# Patient Record
Sex: Male | Born: 1970 | Race: White | Hispanic: No | Marital: Married | Smoking: Former smoker
Health system: Southern US, Community
[De-identification: ages and names within clinical notes are randomized; demographics above are authoritative.]

## PROBLEM LIST (undated history)

## (undated) DIAGNOSIS — I4891 Unspecified atrial fibrillation: Secondary | ICD-10-CM

## (undated) HISTORY — PX: HERNIA REPAIR: SHX51

## (undated) HISTORY — PX: APPENDECTOMY: SHX54

## (undated) HISTORY — PX: CHOLECYSTECTOMY: SHX55

---

## 2012-06-30 ENCOUNTER — Encounter (HOSPITAL_COMMUNITY): Payer: Self-pay | Admitting: Emergency Medicine

## 2012-06-30 ENCOUNTER — Emergency Department (HOSPITAL_COMMUNITY)
Admission: EM | Admit: 2012-06-30 | Discharge: 2012-07-01 | Disposition: A | Discharge status: Home / Self Care | Payer: BC Managed Care – PPO | Attending: Emergency Medicine | Admitting: Emergency Medicine

## 2012-06-30 DIAGNOSIS — R002 Palpitations: Secondary | ICD-10-CM

## 2012-06-30 DIAGNOSIS — R259 Unspecified abnormal involuntary movements: Secondary | ICD-10-CM | POA: Insufficient documentation

## 2012-06-30 DIAGNOSIS — Z87891 Personal history of nicotine dependence: Secondary | ICD-10-CM | POA: Insufficient documentation

## 2012-06-30 DIAGNOSIS — I499 Cardiac arrhythmia, unspecified: Secondary | ICD-10-CM | POA: Insufficient documentation

## 2012-06-30 DIAGNOSIS — R059 Cough, unspecified: Secondary | ICD-10-CM | POA: Insufficient documentation

## 2012-06-30 DIAGNOSIS — R05 Cough: Secondary | ICD-10-CM | POA: Insufficient documentation

## 2012-06-30 DIAGNOSIS — Z8679 Personal history of other diseases of the circulatory system: Secondary | ICD-10-CM | POA: Insufficient documentation

## 2012-06-30 HISTORY — DX: Unspecified atrial fibrillation: I48.91

## 2012-06-30 NOTE — ED Notes (Signed)
Pt states that he has been having pressure and dizziness in his head and fluttering in his chest especially at night, which wakes him up from sleep. Pt states he had an episode of a-fib about 6 months ago.

## 2012-06-30 NOTE — ED Notes (Signed)
EKG given to [facility] at 23:16

## 2012-06-30 NOTE — ED Notes (Signed)
POCT CBG unable to be completed. Unable to draw blood from fingertips after multiple tries. 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 Will need to be checked with a blood draw.

## 2012-07-01 ENCOUNTER — Emergency Department (HOSPITAL_COMMUNITY): Payer: BC Managed Care – PPO

## 2012-07-01 LAB — BASIC METABOLIC PANEL
BUN: 15 mg/dL (ref 6–23)
Calcium: 8.7 mg/dL (ref 8.4–10.5)
Creatinine, Ser: 0.86 mg/dL (ref 0.50–1.35)
GFR calc non Af Amer: >90 mL/min (ref >90)
Glucose, Bld: 103 mg/dL — ABNORMAL HIGH (ref 70–99)

## 2012-07-01 LAB — CBC WITH DIFFERENTIAL/PLATELET
Eosinophils Absolute: 0.8 10*3/uL — ABNORMAL HIGH (ref 0.0–0.7)
Eosinophils Relative: 8 % — ABNORMAL HIGH (ref 0–5)
Hemoglobin: 17.4 g/dL — ABNORMAL HIGH (ref 13.0–17.0)
Lymphs Abs: 2.3 10*3/uL (ref 0.7–4.0)
MCH: 31.4 pg (ref 26.0–34.0)
MCV: 90.4 fL (ref 78.0–100.0)
Monocytes Relative: 11 % (ref 3–12)
Platelets: 216 10*3/uL (ref 150–400)
RBC: 5.54 MIL/uL (ref 4.22–5.81)

## 2012-07-01 LAB — LIPASE, BLOOD: Lipase: 40 U/L (ref 11–59)

## 2012-07-01 LAB — RAPID URINE DRUG SCREEN, HOSP PERFORMED: Barbiturates: NOT DETECTED

## 2012-07-01 LAB — POCT I-STAT TROPONIN I

## 2012-07-01 MED ORDER — METOPROLOL SUCCINATE ER 25 MG PO TB24: 12.5000 mg | ORAL_TABLET | Freq: Every day | ORAL | Status: AC

## 2012-07-01 MED ORDER — ONDANSETRON 8 MG PO TBDP: 8.0000 mg | ORAL_TABLET | Freq: Once | ORAL | Status: DC

## 2012-07-01 MED ORDER — PANTOPRAZOLE SODIUM 40 MG PO TBEC: 40.0000 mg | DELAYED_RELEASE_TABLET | Freq: Every day | ORAL | Status: DC

## 2012-07-01 MED ORDER — GI COCKTAIL ~~LOC~~: 30.0000 mL | Freq: Once | ORAL | Status: DC

## 2012-07-01 NOTE — ED Provider Notes (Signed)
History     CSN: [id number]  Arrival date & time 06/30/12  2240   First MD Initiated Contact with Patient 06/30/12 2326      Chief Complaint  Patient presents with  . Dizziness    (Consider location/radiation/quality/duration/timing/severity/associated sxs/prior treatment) HPI History provided by pt.   Pt reports that he was diagnosed w/ atrial fibrillation at [facility] 4 months ago.  Had presented w/ complaint of waking with full body shaking and irregular heart rate.  Was told that it may be related to acid reflux, to take prilosec, and if it did not resolve, he would have to follow up with cardiology.  He has since started this medication, quit smoking and cut back on alcohol intake, but continues to have these sx intermittently.  For the past 2 weeks, has been woken every night w/ irregular and rapid heart rate w/ diffuse tremors.  Had simultaneous onset of intermittent, brief episodes of lightheadedness, that occur at rest, during the day, and are not associated w/ headache, CP, SOB, palpitations.  Has had cough recently but denies fever, vomiting, diarrhea, abd pain, urinary sx and hematochezia/melena.  Denies drug abuse.  Does not drink a lot of caffeine.  No new medications.  Past Medical History  Diagnosis Date  . A-fib     Past Surgical History  Procedure Laterality Date  . Cholecystectomy    . Appendectomy    . Hernia repair      Family History  Problem Relation Age of Onset  . Diabetes Mother   . Hypertension Mother   . Hyperlipidemia Mother     History  Substance Use Topics  . Smoking status: Former Smoker    Quit date: 04/18/2012  . Smokeless tobacco: Never Used  . Alcohol Use: 12.6 oz/week    21 Cans of beer per week     Comment: Drinks a 40oz every night      Review of Systems  Neurological:       Uncontrollable tongue rolling for the past 6 months as well as recent blurred vision when looking at computer.  Today he was typing letters twice.   All other systems reviewed and are negative.    Allergies  Review of patient's allergies indicates no known allergies.  Home Medications  No current outpatient prescriptions on file.  BP 140/78  Pulse 78  Temp(Src) 97.7 F (36.5 C) (Oral)  Resp 16  Ht 6\' 1"  (1.854 m)  Wt 225 lb (102.059 kg)  BMI 29.69 kg/m2  SpO2 97%  Physical Exam  Nursing note and vitals reviewed. Constitutional: He is oriented to person, place, and time. He appears well-developed and well-nourished. No distress.  HENT:  Head: Normocephalic and atraumatic.  Eyes:  Normal appearance  Neck: Normal range of motion.  No meningeal signs  Cardiovascular: Normal rate and regular rhythm.   Pulmonary/Chest: Effort normal and breath sounds normal. No respiratory distress.  Abdominal: Soft. Bowel sounds are normal. He exhibits no distension and no mass. There is no tenderness. There is no rebound and no guarding.  Genitourinary:  No CVA tenderness  Musculoskeletal: Normal range of motion.  Neurological: He is alert and oriented to person, place, and time.  CN 3-12 intact.  No sensory deficits.  5/5 and equal upper and lower extremity strength.  No past pointing.   Skin: Skin is warm and dry. No rash noted.  Psychiatric: He has a normal mood and affect. His behavior is normal.    ED Course  Procedures (  including critical care time)   Date: 07/01/2012  Rate: 71  Rhythm: normal sinus rhythm  QRS Axis: normal  Intervals: normal  ST/T Wave abnormalities: normal  Conduction Disutrbances:none  Narrative Interpretation:   Old EKG Reviewed: none available   Labs Reviewed  CBC WITH DIFFERENTIAL - Abnormal; Notable for the following:    Hemoglobin 17.4 (*)    Monocytes Absolute 1.1 (*)    Eosinophils Relative 8 (*)    Eosinophils Absolute 0.8 (*)    All other components within normal limits  BASIC METABOLIC PANEL - Abnormal; Notable for the following:    Glucose, Bld 103 (*)    All other components  within normal limits  URINE RAPID DRUG SCREEN (HOSP PERFORMED)  LIPASE, BLOOD  POCT I-STAT TROPONIN I   Dg Chest 2 View  07/01/2012  *RADIOLOGY REPORT*  Clinical Data: Shortness of breath, cough, congestion, dizziness.  CHEST - 2 VIEW  Comparison: 07/11/2011  Findings: Mild hyperinflation.  Peribronchial thickening with central interstitial changes suggesting chronic bronchitis.  No focal airspace consolidation in the lungs.  No blunting of costophrenic angles.  No pneumothorax.  Mediastinal contours appear intact.  No significant changes since the previous study.  IMPRESSION: Mild hyperinflation with chronic bronchitic changes.  No evidence of active pulmonary disease.   Original Report Authenticated By: Burman Nieves, M.D.    Ct Head Wo Contrast  07/01/2012  *RADIOLOGY REPORT*  Clinical Data: Pressure and dizziness in the head and flutter in the chest especially at night.  Episode of atrial fibrillation 6 months ago.  CT HEAD WITHOUT CONTRAST  Technique:  Contiguous axial images were obtained from the base of the skull through the vertex without contrast.  Comparison: None.  Findings: The ventricles and sulci are symmetrical without significant effacement, displacement, or dilatation. No mass effect or midline shift. No abnormal extra-axial fluid collections. The grey-white matter junction is distinct. Basal cisterns are not effaced. No acute intracranial hemorrhage. No depressed skull fractures.  There is partial opacification of bilateral ethmoid air cells.  Mucous membrane thickening in the frontal and sphenoid sinuses.  Retention cysts in the maxillary antra bilaterally. Mastoid air cells are not opacified.  IMPRESSION: No acute intracranial abnormalities.   Original Report Authenticated By: Burman Nieves, M.D.      1. Palpitations       MDM  41yo M, diagnosed w/ atrial fibrillation at [facility] ~4 months ago, otherwise healthy, presents w/ c/o palpitations and intermittent,  brief episodes of self-limited lightheadedness.  No CP/SOB, no h/o anxiety and no recent illnesses, medication changes or drug/alcohol abuse.  Also c/o intermittent blurred vision and uncontrolled tongue movements. On exam, afebrile, heart w/ RRR, lungs clear, no focal neuro deficits.  EKS shows NSR and labs unremarkable.   CXR and CT head negative.  All results discussed w/ patient.  Dr. Patria Mane recommended Cardiology f/u and prescription for low dose beta blocker.  Return precautions discussed.         Otilio Miu, PA-C 07/01/12 [phone]

## 2012-07-02 NOTE — ED Provider Notes (Signed)
Medical screening examination/treatment/procedure(s) were performed by non-physician practitioner and as supervising physician I was immediately available for consultation/collaboration.  Lyanne Co, MD 07/02/12 [phone]

## 2015-06-26 DIAGNOSIS — G4733 Obstructive sleep apnea (adult) (pediatric): Secondary | ICD-10-CM | POA: Diagnosis not present

## 2015-07-27 DIAGNOSIS — G4733 Obstructive sleep apnea (adult) (pediatric): Secondary | ICD-10-CM | POA: Diagnosis not present

## 2016-12-29 DIAGNOSIS — Z7689 Persons encountering health services in other specified circumstances: Secondary | ICD-10-CM | POA: Diagnosis not present

## 2016-12-29 DIAGNOSIS — Z1389 Encounter for screening for other disorder: Secondary | ICD-10-CM | POA: Diagnosis not present

## 2016-12-29 DIAGNOSIS — Z1329 Encounter for screening for other suspected endocrine disorder: Secondary | ICD-10-CM | POA: Diagnosis not present

## 2016-12-29 DIAGNOSIS — Z13220 Encounter for screening for lipoid disorders: Secondary | ICD-10-CM | POA: Diagnosis not present

## 2016-12-29 DIAGNOSIS — M542 Cervicalgia: Secondary | ICD-10-CM | POA: Diagnosis not present

## 2017-01-10 DIAGNOSIS — Z7189 Other specified counseling: Secondary | ICD-10-CM | POA: Diagnosis not present

## 2017-01-10 DIAGNOSIS — Z0000 Encounter for general adult medical examination without abnormal findings: Secondary | ICD-10-CM | POA: Diagnosis not present

## 2017-01-10 DIAGNOSIS — Z6830 Body mass index (BMI) 30.0-30.9, adult: Secondary | ICD-10-CM | POA: Diagnosis not present

## 2017-01-16 DIAGNOSIS — M542 Cervicalgia: Secondary | ICD-10-CM | POA: Diagnosis not present

## 2017-01-24 DIAGNOSIS — Z6830 Body mass index (BMI) 30.0-30.9, adult: Secondary | ICD-10-CM | POA: Diagnosis not present

## 2017-01-24 DIAGNOSIS — R42 Dizziness and giddiness: Secondary | ICD-10-CM | POA: Diagnosis not present

## 2017-01-24 DIAGNOSIS — M542 Cervicalgia: Secondary | ICD-10-CM | POA: Diagnosis not present

## 2017-01-30 DIAGNOSIS — M542 Cervicalgia: Secondary | ICD-10-CM | POA: Diagnosis not present

## 2017-02-07 DIAGNOSIS — F439 Reaction to severe stress, unspecified: Secondary | ICD-10-CM | POA: Diagnosis not present

## 2017-02-07 DIAGNOSIS — Z6830 Body mass index (BMI) 30.0-30.9, adult: Secondary | ICD-10-CM | POA: Diagnosis not present

## 2017-02-07 DIAGNOSIS — M542 Cervicalgia: Secondary | ICD-10-CM | POA: Diagnosis not present

## 2017-02-28 DIAGNOSIS — R413 Other amnesia: Secondary | ICD-10-CM | POA: Diagnosis not present

## 2017-02-28 DIAGNOSIS — Z6831 Body mass index (BMI) 31.0-31.9, adult: Secondary | ICD-10-CM | POA: Diagnosis not present

## 2017-03-29 DIAGNOSIS — M546 Pain in thoracic spine: Secondary | ICD-10-CM | POA: Diagnosis not present

## 2017-03-29 DIAGNOSIS — Z6830 Body mass index (BMI) 30.0-30.9, adult: Secondary | ICD-10-CM | POA: Diagnosis not present

## 2017-03-29 DIAGNOSIS — R030 Elevated blood-pressure reading, without diagnosis of hypertension: Secondary | ICD-10-CM | POA: Diagnosis not present

## 2017-04-04 DIAGNOSIS — M5414 Radiculopathy, thoracic region: Secondary | ICD-10-CM | POA: Diagnosis not present

## 2017-04-10 DIAGNOSIS — R002 Palpitations: Secondary | ICD-10-CM | POA: Diagnosis not present

## 2017-04-10 DIAGNOSIS — H538 Other visual disturbances: Secondary | ICD-10-CM | POA: Diagnosis not present

## 2017-04-10 DIAGNOSIS — R531 Weakness: Secondary | ICD-10-CM | POA: Diagnosis not present

## 2017-04-10 DIAGNOSIS — Z9989 Dependence on other enabling machines and devices: Secondary | ICD-10-CM | POA: Diagnosis not present

## 2017-04-10 DIAGNOSIS — Z87891 Personal history of nicotine dependence: Secondary | ICD-10-CM | POA: Diagnosis not present

## 2017-04-10 DIAGNOSIS — R51 Headache: Secondary | ICD-10-CM | POA: Diagnosis not present

## 2017-04-10 DIAGNOSIS — R42 Dizziness and giddiness: Secondary | ICD-10-CM | POA: Diagnosis not present

## 2017-04-11 DIAGNOSIS — R55 Syncope and collapse: Secondary | ICD-10-CM | POA: Diagnosis not present

## 2017-04-11 DIAGNOSIS — R631 Polydipsia: Secondary | ICD-10-CM | POA: Diagnosis not present

## 2017-04-11 DIAGNOSIS — R0609 Other forms of dyspnea: Secondary | ICD-10-CM | POA: Diagnosis not present

## 2017-04-11 DIAGNOSIS — G4733 Obstructive sleep apnea (adult) (pediatric): Secondary | ICD-10-CM | POA: Diagnosis not present

## 2017-04-11 DIAGNOSIS — K219 Gastro-esophageal reflux disease without esophagitis: Secondary | ICD-10-CM | POA: Diagnosis not present

## 2017-04-11 DIAGNOSIS — R197 Diarrhea, unspecified: Secondary | ICD-10-CM | POA: Diagnosis not present

## 2017-04-11 DIAGNOSIS — R42 Dizziness and giddiness: Secondary | ICD-10-CM | POA: Diagnosis not present

## 2017-04-11 DIAGNOSIS — I10 Essential (primary) hypertension: Secondary | ICD-10-CM | POA: Diagnosis not present

## 2017-04-11 DIAGNOSIS — R358 Other polyuria: Secondary | ICD-10-CM | POA: Diagnosis not present

## 2017-04-11 DIAGNOSIS — R030 Elevated blood-pressure reading, without diagnosis of hypertension: Secondary | ICD-10-CM | POA: Diagnosis not present

## 2017-04-11 DIAGNOSIS — F419 Anxiety disorder, unspecified: Secondary | ICD-10-CM | POA: Diagnosis not present

## 2017-04-11 DIAGNOSIS — R0789 Other chest pain: Secondary | ICD-10-CM | POA: Diagnosis not present

## 2017-04-11 DIAGNOSIS — Z9989 Dependence on other enabling machines and devices: Secondary | ICD-10-CM | POA: Diagnosis not present

## 2017-04-11 DIAGNOSIS — R200 Anesthesia of skin: Secondary | ICD-10-CM | POA: Diagnosis not present

## 2017-04-11 DIAGNOSIS — R079 Chest pain, unspecified: Secondary | ICD-10-CM | POA: Diagnosis not present

## 2017-04-12 DIAGNOSIS — R0789 Other chest pain: Secondary | ICD-10-CM | POA: Diagnosis not present

## 2017-04-20 DIAGNOSIS — Z6830 Body mass index (BMI) 30.0-30.9, adult: Secondary | ICD-10-CM | POA: Diagnosis not present

## 2017-04-20 DIAGNOSIS — R42 Dizziness and giddiness: Secondary | ICD-10-CM | POA: Diagnosis not present

## 2017-05-02 DIAGNOSIS — M546 Pain in thoracic spine: Secondary | ICD-10-CM | POA: Diagnosis not present

## 2017-05-02 DIAGNOSIS — R030 Elevated blood-pressure reading, without diagnosis of hypertension: Secondary | ICD-10-CM | POA: Diagnosis not present

## 2017-05-02 DIAGNOSIS — Z6830 Body mass index (BMI) 30.0-30.9, adult: Secondary | ICD-10-CM | POA: Diagnosis not present

## 2017-05-02 DIAGNOSIS — M545 Low back pain: Secondary | ICD-10-CM | POA: Diagnosis not present

## 2017-05-17 DIAGNOSIS — R51 Headache: Secondary | ICD-10-CM | POA: Diagnosis not present

## 2017-05-17 DIAGNOSIS — R002 Palpitations: Secondary | ICD-10-CM | POA: Diagnosis not present

## 2017-05-17 DIAGNOSIS — R42 Dizziness and giddiness: Secondary | ICD-10-CM | POA: Diagnosis not present

## 2017-05-17 DIAGNOSIS — Z6830 Body mass index (BMI) 30.0-30.9, adult: Secondary | ICD-10-CM | POA: Diagnosis not present

## 2017-05-17 DIAGNOSIS — R413 Other amnesia: Secondary | ICD-10-CM | POA: Diagnosis not present

## 2017-05-17 DIAGNOSIS — G4733 Obstructive sleep apnea (adult) (pediatric): Secondary | ICD-10-CM | POA: Diagnosis not present

## 2017-05-17 DIAGNOSIS — Z9049 Acquired absence of other specified parts of digestive tract: Secondary | ICD-10-CM | POA: Diagnosis not present

## 2017-05-17 DIAGNOSIS — Z87891 Personal history of nicotine dependence: Secondary | ICD-10-CM | POA: Diagnosis not present

## 2017-05-17 DIAGNOSIS — R0789 Other chest pain: Secondary | ICD-10-CM | POA: Diagnosis not present

## 2017-05-17 DIAGNOSIS — Z9989 Dependence on other enabling machines and devices: Secondary | ICD-10-CM | POA: Diagnosis not present

## 2017-05-29 DIAGNOSIS — R569 Unspecified convulsions: Secondary | ICD-10-CM | POA: Diagnosis not present

## 2017-05-29 DIAGNOSIS — R413 Other amnesia: Secondary | ICD-10-CM | POA: Diagnosis not present

## 2017-05-30 DIAGNOSIS — R413 Other amnesia: Secondary | ICD-10-CM | POA: Diagnosis not present

## 2017-05-30 DIAGNOSIS — R569 Unspecified convulsions: Secondary | ICD-10-CM | POA: Diagnosis not present

## 2017-05-31 DIAGNOSIS — R413 Other amnesia: Secondary | ICD-10-CM | POA: Diagnosis not present

## 2017-05-31 DIAGNOSIS — G4733 Obstructive sleep apnea (adult) (pediatric): Secondary | ICD-10-CM | POA: Diagnosis not present

## 2017-06-15 DIAGNOSIS — R0981 Nasal congestion: Secondary | ICD-10-CM | POA: Diagnosis not present

## 2017-06-15 DIAGNOSIS — R413 Other amnesia: Secondary | ICD-10-CM | POA: Diagnosis not present

## 2017-06-15 DIAGNOSIS — G4733 Obstructive sleep apnea (adult) (pediatric): Secondary | ICD-10-CM | POA: Diagnosis not present

## 2017-06-15 DIAGNOSIS — G479 Sleep disorder, unspecified: Secondary | ICD-10-CM | POA: Diagnosis not present

## 2017-06-21 DIAGNOSIS — G8929 Other chronic pain: Secondary | ICD-10-CM | POA: Diagnosis not present

## 2017-06-21 DIAGNOSIS — M5441 Lumbago with sciatica, right side: Secondary | ICD-10-CM | POA: Diagnosis not present

## 2017-07-11 DIAGNOSIS — R262 Difficulty in walking, not elsewhere classified: Secondary | ICD-10-CM | POA: Diagnosis not present

## 2017-07-11 DIAGNOSIS — M2560 Stiffness of unspecified joint, not elsewhere classified: Secondary | ICD-10-CM | POA: Diagnosis not present

## 2017-07-11 DIAGNOSIS — M5417 Radiculopathy, lumbosacral region: Secondary | ICD-10-CM | POA: Diagnosis not present

## 2017-07-11 DIAGNOSIS — M545 Low back pain: Secondary | ICD-10-CM | POA: Diagnosis not present

## 2017-07-14 DIAGNOSIS — Z6831 Body mass index (BMI) 31.0-31.9, adult: Secondary | ICD-10-CM | POA: Diagnosis not present

## 2017-07-14 DIAGNOSIS — R42 Dizziness and giddiness: Secondary | ICD-10-CM | POA: Diagnosis not present

## 2017-07-14 DIAGNOSIS — R262 Difficulty in walking, not elsewhere classified: Secondary | ICD-10-CM | POA: Diagnosis not present

## 2017-07-14 DIAGNOSIS — M545 Low back pain: Secondary | ICD-10-CM | POA: Diagnosis not present

## 2017-07-14 DIAGNOSIS — M2560 Stiffness of unspecified joint, not elsewhere classified: Secondary | ICD-10-CM | POA: Diagnosis not present

## 2017-07-14 DIAGNOSIS — M5417 Radiculopathy, lumbosacral region: Secondary | ICD-10-CM | POA: Diagnosis not present

## 2017-07-18 DIAGNOSIS — Z6832 Body mass index (BMI) 32.0-32.9, adult: Secondary | ICD-10-CM | POA: Diagnosis not present

## 2017-07-18 DIAGNOSIS — R42 Dizziness and giddiness: Secondary | ICD-10-CM | POA: Diagnosis not present

## 2017-07-18 DIAGNOSIS — M5417 Radiculopathy, lumbosacral region: Secondary | ICD-10-CM | POA: Diagnosis not present

## 2017-07-18 DIAGNOSIS — R262 Difficulty in walking, not elsewhere classified: Secondary | ICD-10-CM | POA: Diagnosis not present

## 2017-07-18 DIAGNOSIS — R413 Other amnesia: Secondary | ICD-10-CM | POA: Diagnosis not present

## 2017-07-18 DIAGNOSIS — M2560 Stiffness of unspecified joint, not elsewhere classified: Secondary | ICD-10-CM | POA: Diagnosis not present

## 2017-07-18 DIAGNOSIS — M545 Low back pain: Secondary | ICD-10-CM | POA: Diagnosis not present

## 2017-07-20 DIAGNOSIS — M2560 Stiffness of unspecified joint, not elsewhere classified: Secondary | ICD-10-CM | POA: Diagnosis not present

## 2017-07-20 DIAGNOSIS — R262 Difficulty in walking, not elsewhere classified: Secondary | ICD-10-CM | POA: Diagnosis not present

## 2017-07-20 DIAGNOSIS — M545 Low back pain: Secondary | ICD-10-CM | POA: Diagnosis not present

## 2017-07-20 DIAGNOSIS — M5417 Radiculopathy, lumbosacral region: Secondary | ICD-10-CM | POA: Diagnosis not present

## 2017-07-25 DIAGNOSIS — M5417 Radiculopathy, lumbosacral region: Secondary | ICD-10-CM | POA: Diagnosis not present

## 2017-07-25 DIAGNOSIS — R262 Difficulty in walking, not elsewhere classified: Secondary | ICD-10-CM | POA: Diagnosis not present

## 2017-07-25 DIAGNOSIS — M545 Low back pain: Secondary | ICD-10-CM | POA: Diagnosis not present

## 2017-07-25 DIAGNOSIS — M2560 Stiffness of unspecified joint, not elsewhere classified: Secondary | ICD-10-CM | POA: Diagnosis not present

## 2017-07-27 DIAGNOSIS — M5417 Radiculopathy, lumbosacral region: Secondary | ICD-10-CM | POA: Diagnosis not present

## 2017-07-27 DIAGNOSIS — R262 Difficulty in walking, not elsewhere classified: Secondary | ICD-10-CM | POA: Diagnosis not present

## 2017-07-27 DIAGNOSIS — M2560 Stiffness of unspecified joint, not elsewhere classified: Secondary | ICD-10-CM | POA: Diagnosis not present

## 2017-07-27 DIAGNOSIS — M545 Low back pain: Secondary | ICD-10-CM | POA: Diagnosis not present

## 2017-08-01 DIAGNOSIS — M545 Low back pain: Secondary | ICD-10-CM | POA: Diagnosis not present

## 2017-08-01 DIAGNOSIS — R262 Difficulty in walking, not elsewhere classified: Secondary | ICD-10-CM | POA: Diagnosis not present

## 2017-08-01 DIAGNOSIS — M2560 Stiffness of unspecified joint, not elsewhere classified: Secondary | ICD-10-CM | POA: Diagnosis not present

## 2017-08-01 DIAGNOSIS — M5417 Radiculopathy, lumbosacral region: Secondary | ICD-10-CM | POA: Diagnosis not present

## 2017-08-08 DIAGNOSIS — Z6831 Body mass index (BMI) 31.0-31.9, adult: Secondary | ICD-10-CM | POA: Diagnosis not present

## 2017-08-08 DIAGNOSIS — M545 Low back pain: Secondary | ICD-10-CM | POA: Diagnosis not present

## 2017-08-08 DIAGNOSIS — M2560 Stiffness of unspecified joint, not elsewhere classified: Secondary | ICD-10-CM | POA: Diagnosis not present

## 2017-08-08 DIAGNOSIS — R262 Difficulty in walking, not elsewhere classified: Secondary | ICD-10-CM | POA: Diagnosis not present

## 2017-08-08 DIAGNOSIS — R413 Other amnesia: Secondary | ICD-10-CM | POA: Diagnosis not present

## 2017-08-08 DIAGNOSIS — M5417 Radiculopathy, lumbosacral region: Secondary | ICD-10-CM | POA: Diagnosis not present

## 2017-08-10 DIAGNOSIS — M2560 Stiffness of unspecified joint, not elsewhere classified: Secondary | ICD-10-CM | POA: Diagnosis not present

## 2017-08-10 DIAGNOSIS — M545 Low back pain: Secondary | ICD-10-CM | POA: Diagnosis not present

## 2017-08-10 DIAGNOSIS — M5417 Radiculopathy, lumbosacral region: Secondary | ICD-10-CM | POA: Diagnosis not present

## 2017-08-10 DIAGNOSIS — R262 Difficulty in walking, not elsewhere classified: Secondary | ICD-10-CM | POA: Diagnosis not present

## 2017-08-15 DIAGNOSIS — M2560 Stiffness of unspecified joint, not elsewhere classified: Secondary | ICD-10-CM | POA: Diagnosis not present

## 2017-08-15 DIAGNOSIS — R262 Difficulty in walking, not elsewhere classified: Secondary | ICD-10-CM | POA: Diagnosis not present

## 2017-08-15 DIAGNOSIS — M545 Low back pain: Secondary | ICD-10-CM | POA: Diagnosis not present

## 2017-08-15 DIAGNOSIS — M5417 Radiculopathy, lumbosacral region: Secondary | ICD-10-CM | POA: Diagnosis not present

## 2017-08-17 DIAGNOSIS — R262 Difficulty in walking, not elsewhere classified: Secondary | ICD-10-CM | POA: Diagnosis not present

## 2017-08-17 DIAGNOSIS — M5417 Radiculopathy, lumbosacral region: Secondary | ICD-10-CM | POA: Diagnosis not present

## 2017-08-17 DIAGNOSIS — M2560 Stiffness of unspecified joint, not elsewhere classified: Secondary | ICD-10-CM | POA: Diagnosis not present

## 2017-08-17 DIAGNOSIS — M545 Low back pain: Secondary | ICD-10-CM | POA: Diagnosis not present

## 2017-11-07 DIAGNOSIS — Z6831 Body mass index (BMI) 31.0-31.9, adult: Secondary | ICD-10-CM | POA: Diagnosis not present

## 2017-11-07 DIAGNOSIS — R42 Dizziness and giddiness: Secondary | ICD-10-CM | POA: Diagnosis not present

## 2018-01-23 ENCOUNTER — Emergency Department (HOSPITAL_COMMUNITY)
Admission: EM | Admit: 2018-01-23 | Discharge: 2018-01-23 | Disposition: A | Discharge status: Home / Self Care | Payer: BLUE CROSS/BLUE SHIELD | Attending: Emergency Medicine | Admitting: Emergency Medicine

## 2018-01-23 ENCOUNTER — Other Ambulatory Visit: Payer: Self-pay

## 2018-01-23 ENCOUNTER — Emergency Department (HOSPITAL_COMMUNITY): Payer: BLUE CROSS/BLUE SHIELD

## 2018-01-23 ENCOUNTER — Encounter (HOSPITAL_COMMUNITY): Payer: Self-pay

## 2018-01-23 DIAGNOSIS — Z8679 Personal history of other diseases of the circulatory system: Secondary | ICD-10-CM | POA: Insufficient documentation

## 2018-01-23 DIAGNOSIS — R05 Cough: Secondary | ICD-10-CM | POA: Diagnosis not present

## 2018-01-23 DIAGNOSIS — R0602 Shortness of breath: Secondary | ICD-10-CM | POA: Diagnosis not present

## 2018-01-23 DIAGNOSIS — R509 Fever, unspecified: Secondary | ICD-10-CM | POA: Diagnosis not present

## 2018-01-23 DIAGNOSIS — J40 Bronchitis, not specified as acute or chronic: Secondary | ICD-10-CM | POA: Diagnosis not present

## 2018-01-23 DIAGNOSIS — Z79899 Other long term (current) drug therapy: Secondary | ICD-10-CM | POA: Diagnosis not present

## 2018-01-23 DIAGNOSIS — Z87891 Personal history of nicotine dependence: Secondary | ICD-10-CM | POA: Diagnosis not present

## 2018-01-23 LAB — CBC WITH DIFFERENTIAL/PLATELET
Basophils Absolute: 0 10*3/uL (ref 0.0–0.1)
Basophils Relative: 0 %
Eosinophils Absolute: 0 10*3/uL (ref 0.0–0.5)
Eosinophils Relative: 0 %
HCT: 49.8 % (ref 39.0–52.0)
Hemoglobin: 16.8 g/dL (ref 13.0–17.0)
Lymphocytes Relative: 13 %
Lymphs Abs: 0.9 10*3/uL (ref 0.7–4.0)
MCH: 30.9 pg (ref 26.0–34.0)
MCHC: 33.7 g/dL (ref 30.0–36.0)
MCV: 91.7 fL (ref 80.0–100.0)
Monocytes Absolute: 1.3 10*3/uL — ABNORMAL HIGH (ref 0.1–1.0)
Monocytes Relative: 19 %
Neutro Abs: 4.4 10*3/uL (ref 1.7–7.7)
Neutrophils Relative %: 68 %
Platelets: 193 10*3/uL (ref 150–400)
RBC: 5.43 MIL/uL (ref 4.22–5.81)
RDW: 12.9 % (ref 11.5–15.5)
WBC: 6.6 10*3/uL (ref 4.0–10.5)
nRBC: 0 % (ref 0.0–0.2)

## 2018-01-23 LAB — BASIC METABOLIC PANEL
Anion gap: 11 (ref 5–15)
BUN: 11 mg/dL (ref 6–20)
CO2: 27 mmol/L (ref 22–32)
Calcium: 9.3 mg/dL (ref 8.9–10.3)
Chloride: 105 mmol/L (ref 98–111)
Creatinine, Ser: 1.04 mg/dL (ref 0.61–1.24)
GFR calc Af Amer: >60 mL/min (ref >60)
GFR calc non Af Amer: >60 mL/min (ref >60)
Glucose, Bld: 108 mg/dL — ABNORMAL HIGH (ref 70–99)
Potassium: 3.6 mmol/L (ref 3.5–5.1)
Sodium: 143 mmol/L (ref 135–145)

## 2018-01-23 MED ORDER — DEXAMETHASONE 4 MG PO TABS: 4.0000 mg | ORAL_TABLET | Freq: Two times a day (BID) | ORAL | 0 refills | Status: AC

## 2018-01-23 MED ORDER — AZITHROMYCIN 250 MG PO TABS: 250.0000 mg | ORAL_TABLET | Freq: Every day | ORAL | 0 refills | Status: AC

## 2018-01-23 MED ORDER — IOPAMIDOL (ISOVUE-370) INJECTION 76%
100.0000 mL | Freq: Once | INTRAVENOUS | Status: AC | PRN
  Administered 2018-01-23: 100 mL via INTRAVENOUS

## 2018-01-23 MED ORDER — ALBUTEROL SULFATE HFA 108 (90 BASE) MCG/ACT IN AERS
2.0000 | INHALATION_SPRAY | Freq: Once | RESPIRATORY_TRACT | Status: AC
Start: 2018-01-23 — End: 2018-01-23
  Administered 2018-01-23: 2 via RESPIRATORY_TRACT
  Filled 2018-01-23: qty 6.7

## 2018-01-23 MED ORDER — IOPAMIDOL (ISOVUE-370) INJECTION 76%
INTRAVENOUS | Status: AC
  Filled 2018-01-23: qty 100

## 2018-01-23 MED ORDER — DEXAMETHASONE SODIUM PHOSPHATE 10 MG/ML IJ SOLN
10.0000 mg | Freq: Once | INTRAMUSCULAR | Status: AC
  Administered 2018-01-23: 10 mg via INTRAVENOUS
  Filled 2018-01-23: qty 1

## 2018-01-23 MED ORDER — LACTATED RINGERS IV BOLUS
1000.0000 mL | Freq: Once | INTRAVENOUS | Status: AC
  Administered 2018-01-23: 1000 mL via INTRAVENOUS

## 2018-01-23 MED ORDER — SODIUM CHLORIDE 0.9 % IJ SOLN
INTRAMUSCULAR | Status: AC
  Filled 2018-01-23: qty 50

## 2018-01-23 NOTE — ED Notes (Signed)
He is back from CT and remains in no distress.

## 2018-01-23 NOTE — ED Triage Notes (Signed)
He states he just returned from the [place] having vacationed there. He states "A lot of the locals there had a cold or something". He states he awoke this morning with a fever, and for a moment couldn't remember where he was. He is alert and oriented x 4 with clear speech.

## 2018-01-23 NOTE — ED Provider Notes (Signed)
[facility]-EMERGENCY DEPT Provider Note   CSN: [id number] Arrival date & time: 01/23/18  4098     History   Chief Complaint Chief Complaint  Patient presents with  . Fever  . Cough    HPI Sergio Herman is a 47 y.o. male.  HPI   47 year old male with fever and cough.  Onset 2 days ago.  Feels like symptoms are worsening.  He recently returned from vacation in the [place] and suspects he may have contracted a respiratory virus.  Cough is nonproductive.  Reports that history of the [place] in detail flights maneuvers internally approximately 30 hours.  Denies any unusual leg pain or swelling.  No past history of DVT. Past Medical History:  Diagnosis Date  . A-fib (HCC)     There are no active problems to display for this patient.   Past Surgical History:  Procedure Laterality Date  . APPENDECTOMY    . CHOLECYSTECTOMY    . HERNIA REPAIR          Home Medications    Prior to Admission medications   Medication Sig Start Date End Date Taking? Authorizing Provider  cetirizine (ZYRTEC) 10 MG tablet Take 10 mg by mouth daily.   Yes [provider]  ibuprofen (ADVIL,MOTRIN) 200 MG tablet Take 1,000 mg by mouth every 6 (six) hours as needed for fever or moderate pain.   Yes [provider]  metoprolol succinate (TOPROL XL) 25 MG 24 hr tablet Take 0.5 tablets (12.5 mg total) by mouth daily. Patient not taking: Reported on 01/23/2018 07/01/12   Ruby Cola, PA-C    Family History Family History  Problem Relation Age of Onset  . Diabetes Mother   . Hypertension Mother   . Hyperlipidemia Mother     Social History Social History   Tobacco Use  . Smoking status: Former Smoker    Last attempt to quit: 04/18/2012    Years since quitting: 5.7  . Smokeless tobacco: Never Used  Substance Use Topics  . Alcohol use: Yes    Alcohol/week: 21.0 standard drinks    Types: 21 Cans of beer per week    Comment: Drinks a 40oz  every night  . Drug use: No     Allergies   Patient has no known allergies.   Review of Systems Review of Systems All systems reviewed and negative, other than as noted in HPI.   Physical Exam Updated Vital Signs BP 118/67 (BP Location: Left Arm)   Pulse (!) 102   Temp 99.3 F (37.4 C) (Oral)   Resp 20   Ht 6' 0.75" (1.848 m)   Wt 108.9 kg   SpO2 97%   BMI 31.88 kg/m   Physical Exam  Constitutional: He appears well-developed and well-nourished. No distress.  HENT:  Head: Normocephalic and atraumatic.  Eyes: Conjunctivae are normal. Right eye exhibits no discharge. Left eye exhibits no discharge.  Neck: Neck supple.  Cardiovascular: Normal rate, regular rhythm and normal heart sounds. Exam reveals no gallop and no friction rub.  No murmur heard. Pulmonary/Chest: Effort normal and breath sounds normal. No respiratory distress.  Abdominal: Soft. He exhibits no distension. There is no tenderness.  Musculoskeletal: He exhibits no edema or tenderness.  Lower extremities symmetric as compared to each other. No calf tenderness. Negative Homan's. No palpable cords.   Neurological: He is alert.  Skin: Skin is warm and dry.  Psychiatric: He has a normal mood and affect. His behavior is normal. Thought content normal.  Nursing note and vitals reviewed.    ED Treatments / Results  Labs (all labs ordered are listed, but only abnormal results are displayed) Labs Reviewed  CBC WITH DIFFERENTIAL/PLATELET - Abnormal; Notable for the following components:      Result Value   Monocytes Absolute 1.3 (*)    All other components within normal limits  BASIC METABOLIC PANEL    EKG None  Radiology No results found.   Dg Chest 2 View  Result Date: 01/23/2018 CLINICAL DATA:  Cough, congestion, shortness of breath EXAM: CHEST - 2 VIEW COMPARISON:  11/04/2014 FINDINGS: Mild peribronchial thickening and interstitial prominence. Heart is normal size. No confluent opacities or  effusions. No acute bony abnormality. IMPRESSION: Mild bronchitic changes. Electronically Signed   By: Charlett Nose M.D.   On: 01/23/2018 09:27   Ct Angio Chest Pe W And/or Wo Contrast  Result Date: 01/23/2018 CLINICAL DATA:  Shortness of breath, chest pain.  Recent travel. EXAM: CT ANGIOGRAPHY CHEST WITH CONTRAST TECHNIQUE: Multidetector CT imaging of the chest was performed using the standard protocol during bolus administration of intravenous contrast. Multiplanar CT image reconstructions and MIPs were obtained to evaluate the vascular anatomy. CONTRAST:  ISOVUE-370 IOPAMIDOL (ISOVUE-370) INJECTION 76% COMPARISON:  Chest x-ray today. FINDINGS: Cardiovascular: No filling defects in the pulmonary arteries to suggest pulmonary emboli. Heart is normal size. Aorta is normal caliber. Mediastinum/Nodes: No mediastinal, hilar, or axillary adenopathy. Lungs/Pleura: Mild central peribronchial thickening. No confluent opacities or effusions. Upper Abdomen: Fatty infiltration of the liver. Prior cholecystectomy. Musculoskeletal: Chest wall soft tissues are unremarkable. No acute bony abnormality. Review of the MIP images confirms the above findings. IMPRESSION: No evidence of pulmonary embolus. Mild central peribronchial thickening compatible with bronchitis. Fatty infiltration of the liver. Electronically Signed   By: Charlett Nose M.D.   On: 01/23/2018 10:55    Procedures Procedures (including critical care time)  Medications Ordered in ED Medications  lactated ringers bolus 1,000 mL (1,000 mLs Intravenous New Bag/Given 01/23/18 0941)     Initial Impression / Assessment and Plan / ED Course  I have reviewed the triage vital signs and the nursing notes.  Pertinent labs & imaging results that were available during my care of the patient were reviewed by me and considered in my medical decision making (see chart for details).     47 year old male with likely viral bronchitis.  CT angiography  negative for PE or acute pulmonary process aside from some pure bronchial cuffing.  He is afebrile.  Pills recently comfortable please rest.  Symptomatic treatment.  Return precautions discussed.  Final Clinical Impressions(s) / ED Diagnoses   Final diagnoses:  Bronchitis    ED Discharge Orders         Ordered    dexamethasone (DECADRON) 4 MG tablet  2 times daily     01/23/18 1202    azithromycin (ZITHROMAX) 250 MG tablet  Daily     01/23/18 1202           Raeford Razor, MD 01/29/18 1436

## 2018-03-23 DIAGNOSIS — G478 Other sleep disorders: Secondary | ICD-10-CM | POA: Diagnosis not present

## 2018-03-23 DIAGNOSIS — R413 Other amnesia: Secondary | ICD-10-CM | POA: Diagnosis not present

## 2018-03-23 DIAGNOSIS — Z1329 Encounter for screening for other suspected endocrine disorder: Secondary | ICD-10-CM | POA: Diagnosis not present

## 2018-03-23 DIAGNOSIS — Z13220 Encounter for screening for lipoid disorders: Secondary | ICD-10-CM | POA: Diagnosis not present

## 2018-03-23 DIAGNOSIS — G4733 Obstructive sleep apnea (adult) (pediatric): Secondary | ICD-10-CM | POA: Diagnosis not present

## 2018-03-23 DIAGNOSIS — Z0001 Encounter for general adult medical examination with abnormal findings: Secondary | ICD-10-CM | POA: Diagnosis not present

## 2018-03-23 DIAGNOSIS — Z9989 Dependence on other enabling machines and devices: Secondary | ICD-10-CM | POA: Diagnosis not present

## 2018-03-23 DIAGNOSIS — Z6831 Body mass index (BMI) 31.0-31.9, adult: Secondary | ICD-10-CM | POA: Diagnosis not present

## 2018-03-23 DIAGNOSIS — R51 Headache: Secondary | ICD-10-CM | POA: Diagnosis not present

## 2018-04-06 DIAGNOSIS — R51 Headache: Secondary | ICD-10-CM | POA: Diagnosis not present

## 2018-04-06 DIAGNOSIS — Z0000 Encounter for general adult medical examination without abnormal findings: Secondary | ICD-10-CM | POA: Diagnosis not present

## 2018-11-27 DIAGNOSIS — R5383 Other fatigue: Secondary | ICD-10-CM | POA: Diagnosis not present

## 2018-11-27 DIAGNOSIS — R0609 Other forms of dyspnea: Secondary | ICD-10-CM | POA: Diagnosis not present

## 2018-11-27 DIAGNOSIS — R0600 Dyspnea, unspecified: Secondary | ICD-10-CM | POA: Diagnosis not present

## 2018-11-27 DIAGNOSIS — R0602 Shortness of breath: Secondary | ICD-10-CM | POA: Diagnosis not present

## 2018-12-04 DIAGNOSIS — I517 Cardiomegaly: Secondary | ICD-10-CM | POA: Diagnosis not present

## 2018-12-04 DIAGNOSIS — R0600 Dyspnea, unspecified: Secondary | ICD-10-CM | POA: Diagnosis not present

## 2018-12-07 DIAGNOSIS — D582 Other hemoglobinopathies: Secondary | ICD-10-CM | POA: Diagnosis not present

## 2018-12-07 DIAGNOSIS — R0600 Dyspnea, unspecified: Secondary | ICD-10-CM | POA: Diagnosis not present

## 2019-01-07 DIAGNOSIS — R0789 Other chest pain: Secondary | ICD-10-CM | POA: Diagnosis not present

## 2019-01-17 DIAGNOSIS — R5383 Other fatigue: Secondary | ICD-10-CM | POA: Diagnosis not present

## 2019-01-17 DIAGNOSIS — R300 Dysuria: Secondary | ICD-10-CM | POA: Diagnosis not present

## 2019-02-04 DIAGNOSIS — R718 Other abnormality of red blood cells: Secondary | ICD-10-CM | POA: Diagnosis not present

## 2019-02-04 DIAGNOSIS — D751 Secondary polycythemia: Secondary | ICD-10-CM | POA: Diagnosis not present

## 2019-02-04 DIAGNOSIS — G4733 Obstructive sleep apnea (adult) (pediatric): Secondary | ICD-10-CM | POA: Diagnosis not present

## 2019-02-04 DIAGNOSIS — D582 Other hemoglobinopathies: Secondary | ICD-10-CM | POA: Diagnosis not present

## 2019-02-12 DIAGNOSIS — D582 Other hemoglobinopathies: Secondary | ICD-10-CM | POA: Diagnosis not present

## 2019-04-09 DIAGNOSIS — D582 Other hemoglobinopathies: Secondary | ICD-10-CM | POA: Diagnosis not present

## 2019-04-09 DIAGNOSIS — M546 Pain in thoracic spine: Secondary | ICD-10-CM | POA: Diagnosis not present

## 2019-04-09 DIAGNOSIS — G4733 Obstructive sleep apnea (adult) (pediatric): Secondary | ICD-10-CM | POA: Diagnosis not present

## 2019-04-09 DIAGNOSIS — K219 Gastro-esophageal reflux disease without esophagitis: Secondary | ICD-10-CM | POA: Diagnosis not present

## 2020-07-30 IMAGING — CT CT ANGIO CHEST
2 of 6 series · 19 of 46 positions shown · IV contrast (iopamidol)
Comparison: Chest x-ray today.

CLINICAL DATA: Shortness of breath, chest pain.  Recent travel.

EXAM:
CT ANGIOGRAPHY CHEST WITH CONTRAST
TECHNIQUE: Multidetector CT imaging of the chest was performed using the
standard protocol during bolus administration of intravenous
contrast. Multiplanar CT image reconstructions and MIPs were
obtained to evaluate the vascular anatomy.
CONTRAST:  100mL I37IRP-D3F IOPAMIDOL (I37IRP-D3F) INJECTION 76%

[Series 5: thins · axial · 0.79mm/px · z∈[+1286,+1561]mm · 16 of 303 slices shown]
[im 14/303  lung]
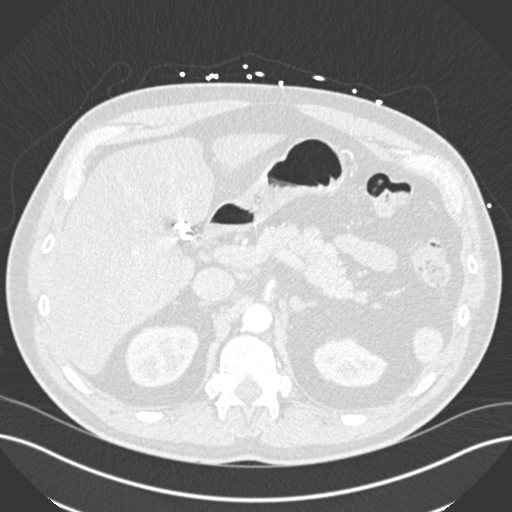
[im 40/303  soft-tissue]
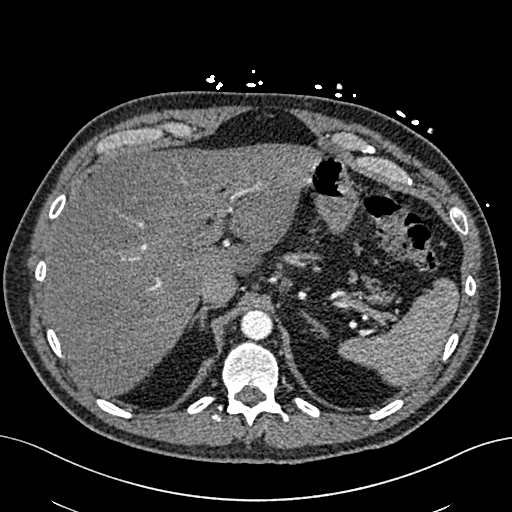
[im 53/303  lung]
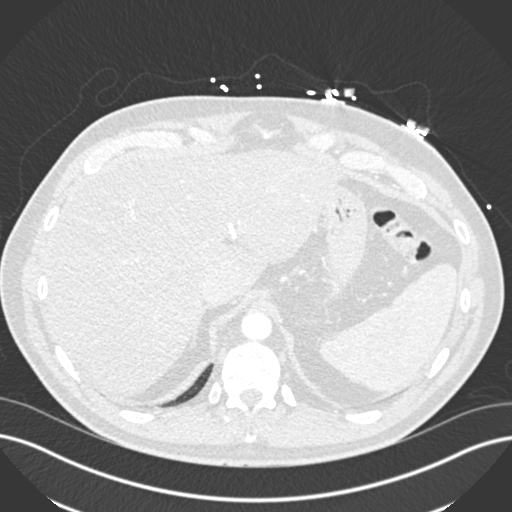
[im 66/303  soft-tissue]
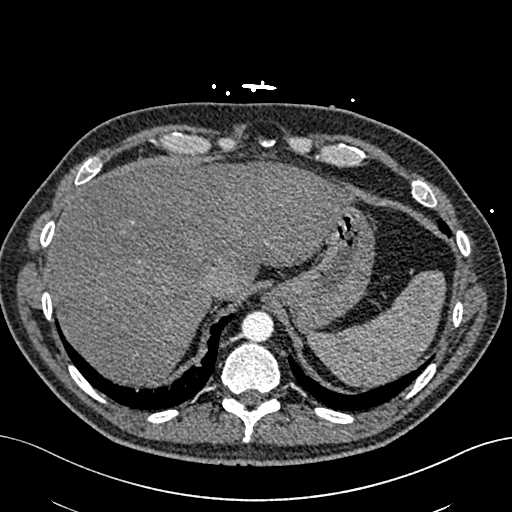
[im 92/303  lung]
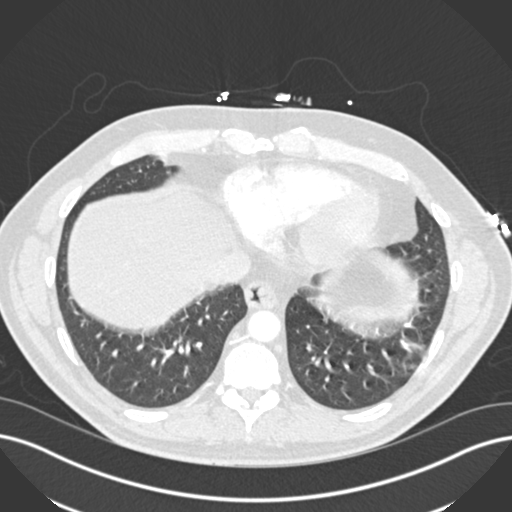
[im 106/303  soft-tissue]
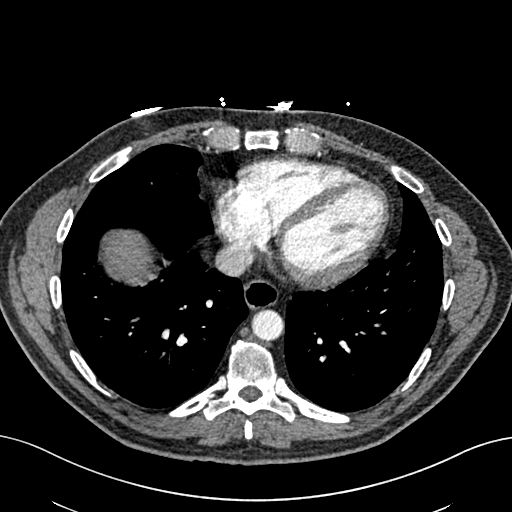
[im 119/303  lung]
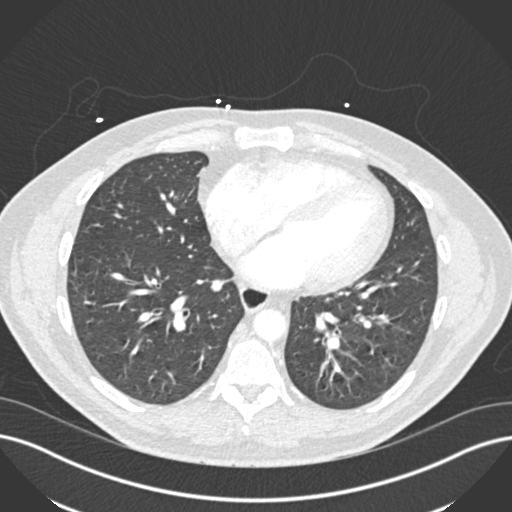
[im 145/303  soft-tissue]
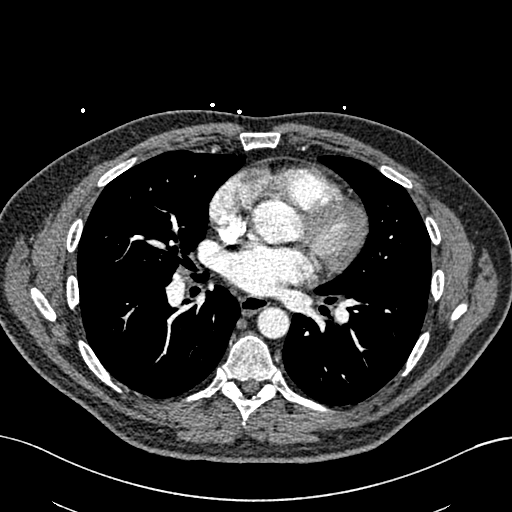
[im 158/303  lung]
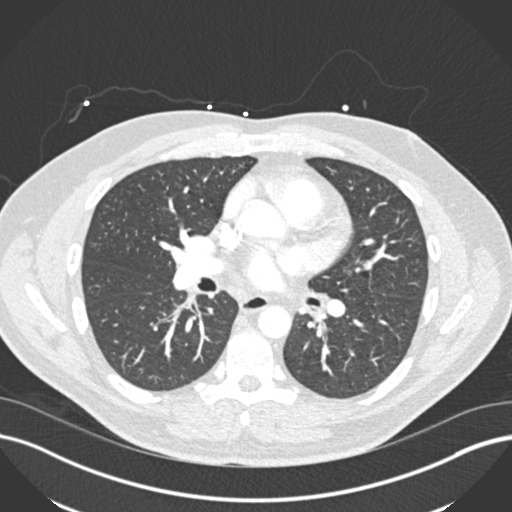
[im 184/303  soft-tissue]
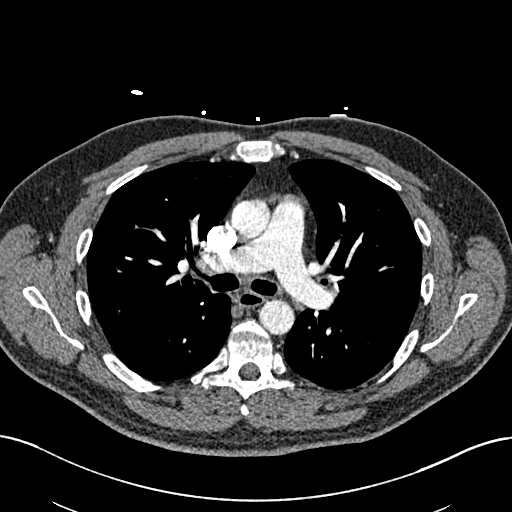
[im 197/303  lung]
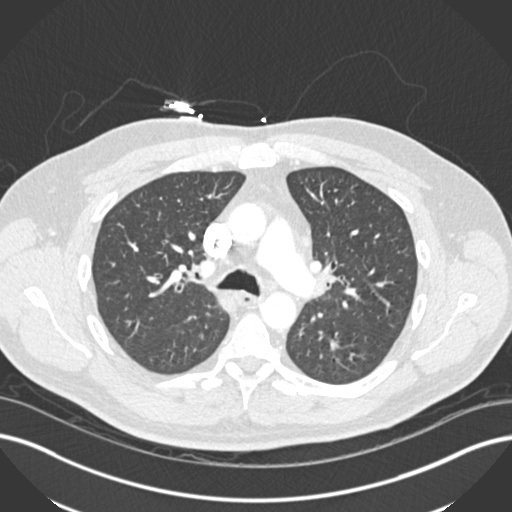
[im 211/303  soft-tissue]
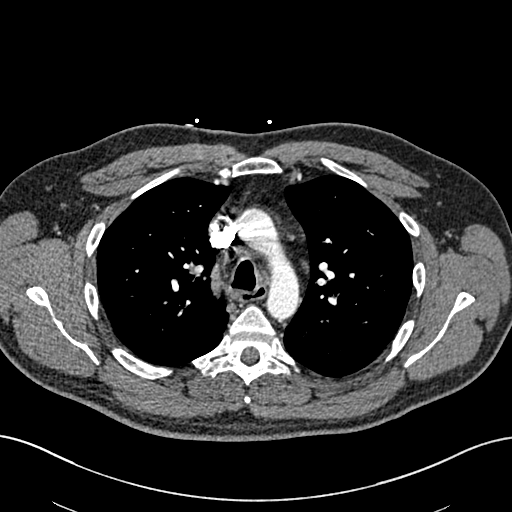
[im 237/303  lung]
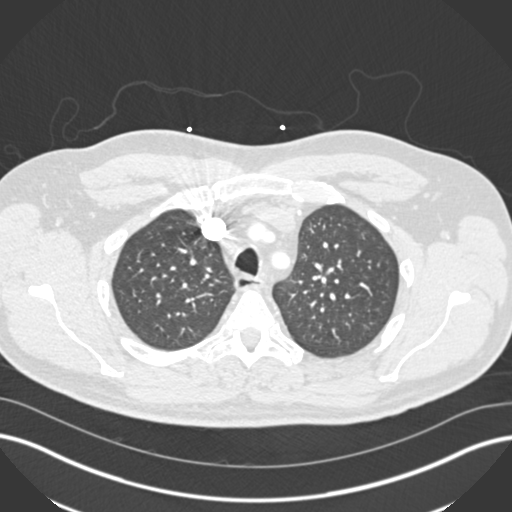
[im 250/303  soft-tissue]
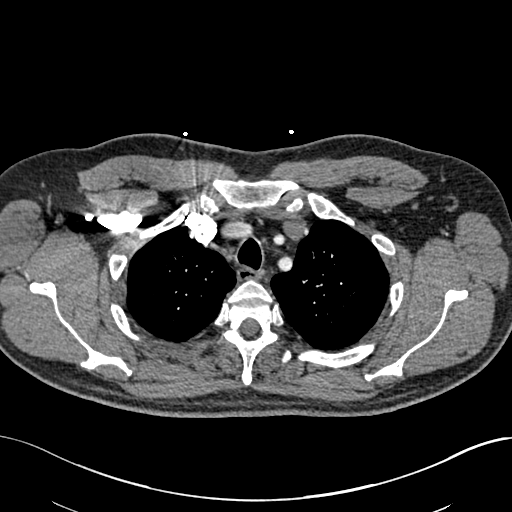
[im 263/303  lung]
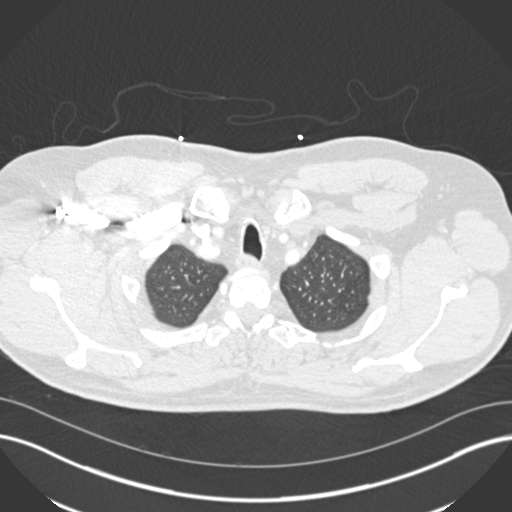
[im 289/303  soft-tissue]
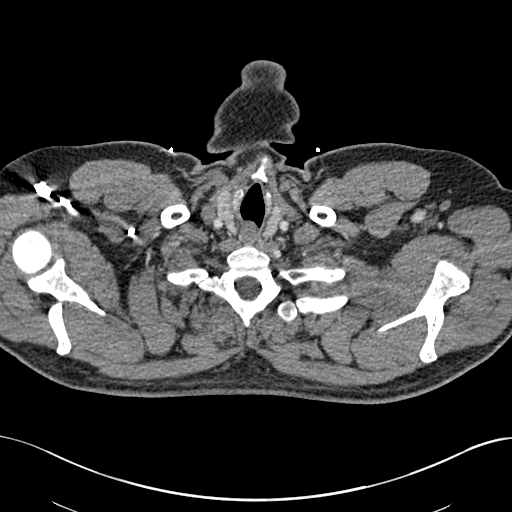

[Series 7: coronal mpr · coronal · 0.71mm/px · 3 of 151 slices shown]
[im 38/151  soft-tissue]
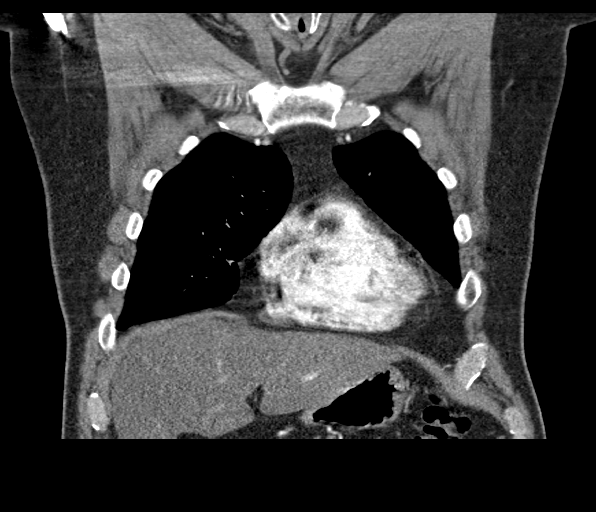
[im 76/151  soft-tissue]
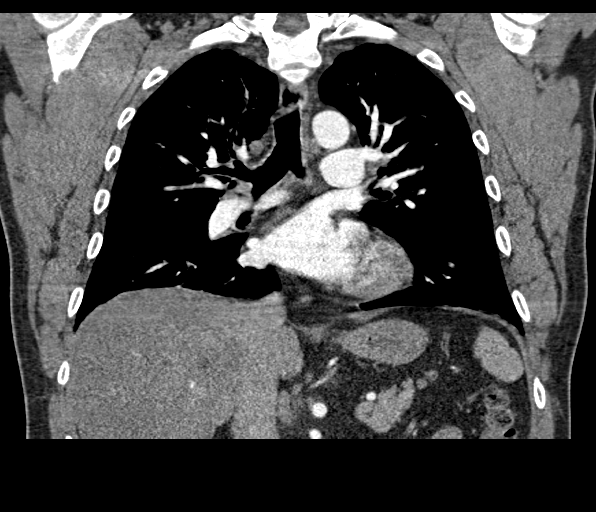
[im 113/151  soft-tissue]
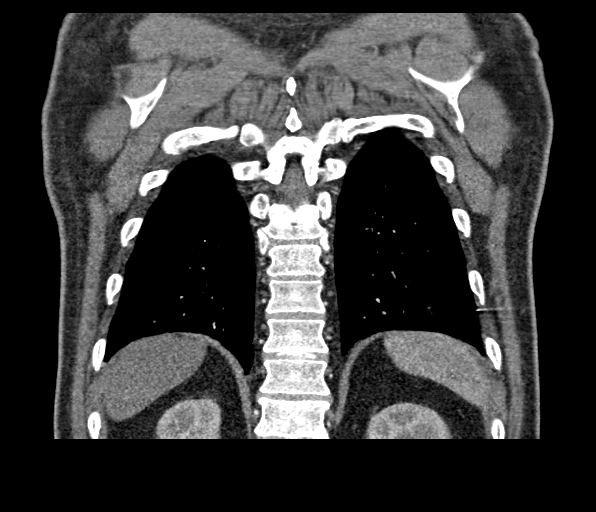

[19 of 46 positions shown; findings below may reference images not displayed]

FINDINGS: Cardiovascular: No filling defects in the pulmonary arteries to
suggest pulmonary emboli. Heart is normal size. Aorta is normal
caliber.

Mediastinum/Nodes: No mediastinal, hilar, or axillary adenopathy.

Lungs/Pleura: Mild central peribronchial thickening. No confluent
opacities or effusions.

Upper Abdomen: Fatty infiltration of the liver. Prior
cholecystectomy.

Musculoskeletal: Chest wall soft tissues are unremarkable. No acute
bony abnormality.

Review of the MIP images confirms the above findings.
IMPRESSION: No evidence of pulmonary embolus.

Mild central peribronchial thickening compatible with bronchitis.

Fatty infiltration of the liver.

## 2020-11-02 ENCOUNTER — Ambulatory Visit: Payer: Self-pay

## 2020-11-02 ENCOUNTER — Encounter: Payer: Self-pay | Admitting: Physician Assistant

## 2020-11-02 ENCOUNTER — Other Ambulatory Visit: Payer: Self-pay

## 2020-11-02 ENCOUNTER — Ambulatory Visit (INDEPENDENT_AMBULATORY_CARE_PROVIDER_SITE_OTHER): Payer: BC Managed Care – PPO | Admitting: Physician Assistant

## 2020-11-02 DIAGNOSIS — M7542 Impingement syndrome of left shoulder: Secondary | ICD-10-CM | POA: Diagnosis not present

## 2020-11-02 DIAGNOSIS — M25512 Pain in left shoulder: Secondary | ICD-10-CM

## 2020-11-02 NOTE — Progress Notes (Signed)
Office Visit Note   Patient: Sergio Herman           Date of Birth: 1970/12/31           MRN: [id number] Visit Date: 11/02/2020              Requested by: No referring provider defined for this encounter. PCP: Patient, No Pcp Per (Inactive)   Assessment & Plan: Visit Diagnoses:  1. Left shoulder pain, unspecified chronicity   2. Impingement syndrome of left shoulder     Plan: Given the fact the patient does not tolerate steroid injections or oral steroids refrain from any type of intra-articular injection or subacromial injection of the shoulder.  Therefore we will send him to formal physical therapy to work on range of motion, strengthening, home exercise program for the left shoulder.  Also they will include modalities.  He is given a prescription for therapy.  We will see him back in 4 to 6 weeks to see how he is doing overall.  Questions were encouraged and answered at length.  Follow-Up Instructions: Return in about 4 weeks (around 11/30/2020).   Orders:  Orders Placed This Encounter  Procedures   XR Shoulder Left   No orders of the defined types were placed in this encounter.     Procedures: No procedures performed   Clinical Data: No additional findings.   Subjective: Chief Complaint  Patient presents with   Left Shoulder - Pain    HPI Sergio Herman is a 50 year old male who comes in today with left shoulder pain.  He states he has had pain in the left shoulder for the past 4 months no known injury.  Pain is worse at night.  Sergio Herman his wife is laying on his arm.  Occasionally has some numbness tingling in his hand this occurs in both hands.  Denies any neck pain.  He is unable to tolerate steroid injections in his knees because headaches and some confusion.  He has had no therapy no treatment for the shoulder pain. Review of Systems See HPI  Objective: Vital Signs: There were no vitals taken for this visit.  Physical Exam Constitutional:      General: He is  not in acute distress.    Appearance: He is not ill-appearing or diaphoretic.  Cardiovascular:     Pulses: Normal pulses.  Pulmonary:     Effort: Pulmonary effort is normal.  Neurological:     Mental Status: He is oriented to person, place, and time.    Ortho Exam Bilateral shoulders he has 5 out of 5 strength with internal rotation against resistance bilaterally.  He has 4-5 strength on the left against external rotation.  Empty can test is negative bilaterally.  Liftoff test negative bilaterally.  Impingement testing positive on the left. Cervical spine full range of motion without pain.  Negative Spurling's. Bilateral hands full sensation full motor.  Negative Tinel's over the median nerve at the wrist bilaterally.  Negative compression test over the median nerve at the wrist bilaterally.  Negative Phalen's bilaterally.  Specialty Comments:  No specialty comments available.  Imaging: XR Shoulder Left  Result Date: 11/02/2020 Left shoulder 3 views: No acute fracture.  No subluxation dislocation.  Glenohumeral joints well-maintained.  Subacromial space well-maintained.  No significant arthritic changes about the left shoulder girdle.  AC joint is well-maintained.    PMFS History: There are no problems to display for this patient.  Past Medical History:  Diagnosis Date   A-fib (HCC)  Family History  Problem Relation Age of Onset   Diabetes Mother    Hypertension Mother    Hyperlipidemia Mother     Past Surgical History:  Procedure Laterality Date   APPENDECTOMY     CHOLECYSTECTOMY     HERNIA REPAIR     Social History   Occupational History   Not on file  Tobacco Use   Smoking status: Former    Types: Cigarettes    Quit date: 04/18/2012    Years since quitting: 8.5   Smokeless tobacco: Never  Substance and Sexual Activity   Alcohol use: Yes    Alcohol/week: 21.0 standard drinks    Types: 21 Cans of beer per week    Comment: Drinks a 40oz every night   Drug  use: No   Sexual activity: Not on file

## 2020-12-14 ENCOUNTER — Ambulatory Visit: Payer: BC Managed Care – PPO | Admitting: Physician Assistant

## 2021-03-21 ENCOUNTER — Emergency Department (HOSPITAL_COMMUNITY)
Admission: EM | Admit: 2021-03-21 | Discharge: 2021-03-21 | Disposition: A | Discharge status: Home / Self Care | Payer: BC Managed Care – PPO | Attending: Emergency Medicine | Admitting: Emergency Medicine

## 2021-03-21 ENCOUNTER — Emergency Department (HOSPITAL_BASED_OUTPATIENT_CLINIC_OR_DEPARTMENT_OTHER): Payer: BC Managed Care – PPO

## 2021-03-21 ENCOUNTER — Encounter (HOSPITAL_COMMUNITY): Payer: Self-pay | Admitting: Emergency Medicine

## 2021-03-21 DIAGNOSIS — M79604 Pain in right leg: Secondary | ICD-10-CM

## 2021-03-21 DIAGNOSIS — M79605 Pain in left leg: Secondary | ICD-10-CM | POA: Diagnosis present

## 2021-03-21 DIAGNOSIS — Z87891 Personal history of nicotine dependence: Secondary | ICD-10-CM | POA: Diagnosis not present

## 2021-03-21 DIAGNOSIS — I4891 Unspecified atrial fibrillation: Secondary | ICD-10-CM | POA: Insufficient documentation

## 2021-03-21 DIAGNOSIS — R600 Localized edema: Secondary | ICD-10-CM

## 2021-03-21 LAB — CBC WITH DIFFERENTIAL/PLATELET
Abs Immature Granulocytes: 0.01 10*3/uL (ref 0.00–0.07)
Basophils Absolute: 0 10*3/uL (ref 0.0–0.1)
Basophils Relative: 0 %
Eosinophils Absolute: 0.2 10*3/uL (ref 0.0–0.5)
Eosinophils Relative: 3 %
HCT: 53.3 % — ABNORMAL HIGH (ref 39.0–52.0)
Hemoglobin: 18.2 g/dL — ABNORMAL HIGH (ref 13.0–17.0)
Immature Granulocytes: 0 %
Lymphocytes Relative: 30 %
Lymphs Abs: 2.3 10*3/uL (ref 0.7–4.0)
MCH: 31.8 pg (ref 26.0–34.0)
MCHC: 34.1 g/dL (ref 30.0–36.0)
MCV: 93.2 fL (ref 80.0–100.0)
Monocytes Absolute: 1 10*3/uL (ref 0.1–1.0)
Monocytes Relative: 13 %
Neutro Abs: 4.1 10*3/uL (ref 1.7–7.7)
Neutrophils Relative %: 54 %
Platelets: 222 10*3/uL (ref 150–400)
RBC: 5.72 MIL/uL (ref 4.22–5.81)
RDW: 12.2 % (ref 11.5–15.5)
WBC: 7.7 10*3/uL (ref 4.0–10.5)
nRBC: 0 % (ref 0.0–0.2)

## 2021-03-21 LAB — BASIC METABOLIC PANEL
Anion gap: 5 (ref 5–15)
BUN: 12 mg/dL (ref 6–20)
CO2: 25 mmol/L (ref 22–32)
Calcium: 8.5 mg/dL — ABNORMAL LOW (ref 8.9–10.3)
Chloride: 107 mmol/L (ref 98–111)
Creatinine, Ser: 0.84 mg/dL (ref 0.61–1.24)
GFR, Estimated: >60 mL/min (ref >60)
Glucose, Bld: 103 mg/dL — ABNORMAL HIGH (ref 70–99)
Potassium: 4.1 mmol/L (ref 3.5–5.1)
Sodium: 137 mmol/L (ref 135–145)

## 2021-03-21 NOTE — ED Provider Notes (Signed)
Emergency Medicine Provider Triage Evaluation Note  Sergio Herman , a 50 y.o. male  was evaluated in triage.  Pt complains of left lower extremity pain.  Patient states pain x2 weeks.  Woke up this morning with pain starting in right calf.  No recent travel, surgery.  No prior history of PE or DVT however does state he was told that "my blood is thick."  He denies any prior history of actual clotting disorder, hemochromatosis. No anticoag. No chest pain, shortness of breath.  No PND or orthopnea.  States he has noted some swelling to his legs, initially started at left ankle and now feels it more in his left calf.  No bilateral pitting edema.  No paresthesias or weakness  Review of Systems  Positive: Lower extremity pain Negative: Fever, chills, chest pain, shortness of breath  Physical Exam  There were no vitals taken for this visit. Gen:   Awake, no distress   Resp:  Normal effort  MSK:   Moves extremities without difficulty, posterior calf tenderness left greater than right Other:    Medical Decision Making  Medically screening exam initiated at 1:43 PM.  Appropriate orders placed.  Sergio Herman was informed that the remainder of the evaluation will be completed by another provider, this initial triage assessment does not replace that evaluation, and the importance of remaining in the ED until their evaluation is complete.  Lower extremity swelling, pain, history of clotting disorder.   Novis [place] A, PA-C 03/21/21 1345    Derwood Kaplan, MD 03/21/21 1402

## 2021-03-21 NOTE — Progress Notes (Signed)
Lower extremity venous bilateral study completed.  Preliminary results relayed to [place], PA.  See CV Proc for preliminary results report.   Jean Rosenthal, RDMS, RVT

## 2021-03-21 NOTE — ED Triage Notes (Signed)
PT c/o swelling to L foot with pain in calf.

## 2021-03-21 NOTE — ED Provider Notes (Signed)
[facility] DEPT Provider Note   CSN: [id number] Arrival date & time: 03/21/21  1329     History Chief Complaint  Patient presents with   Leg Pain    Sergio Herman is a 50 y.o. male.  The history is provided by the patient. No language interpreter was used.  Leg Pain  50 year old male without significant history of clotting disorder who presents for evaluation of pain to his left lower extremity.  Patient report for the past week he has noticed that his left foot is more swollen compared to the right foot especially when he puts on his shoes and socks.  Yesterday he also noticed some tenderness to his calf.  He did some googling and was concerned for potential blood clot.  He reached out to his PCP to request a follow-up and also went to urgent care center to be evaluated but was told to come to the ER for further evaluation.  He reports that the pain is minimal, he denies having fever chest pain shortness of breath productive cough hemoptysis.  He denies any recent injury any recent travel prolonged bedrest active cancer.  No history of CHF.  No fever chills.  Denies any numbness or weakness.  Past Medical History:  Diagnosis Date   A-fib ([place])     There are no problems to display for this patient.   Past Surgical History:  Procedure Laterality Date   APPENDECTOMY     CHOLECYSTECTOMY     HERNIA REPAIR         Family History  Problem Relation Age of Onset   Diabetes Mother    Hypertension Mother    Hyperlipidemia Mother     Social History   Tobacco Use   Smoking status: Former    Types: Cigarettes    Quit date: 04/18/2012    Years since quitting: 8.9   Smokeless tobacco: Never  Substance Use Topics   Alcohol use: Yes    Alcohol/week: 21.0 standard drinks    Types: 21 Cans of beer per week    Comment: Drinks a 40oz every night   Drug use: No    Home Medications Prior to Admission medications   Medication Sig Start Date End Date  Taking? Authorizing Provider  azithromycin (ZITHROMAX) 250 MG tablet Take 1 tablet (250 mg total) by mouth daily. Take first 2 tablets together, then 1 every day until finished. 01/23/18   Virgel Manifold, MD  cetirizine (ZYRTEC) 10 MG tablet Take 10 mg by mouth daily.    [provider]  dexamethasone (DECADRON) 4 MG tablet Take 1 tablet (4 mg total) by mouth 2 (two) times daily. 01/23/18   Virgel Manifold, MD  ibuprofen (ADVIL,MOTRIN) 200 MG tablet Take 1,000 mg by mouth every 6 (six) hours as needed for fever or moderate pain.    [provider]  metoprolol succinate (TOPROL XL) 25 MG 24 hr tablet Take 0.5 tablets (12.5 mg total) by mouth daily. Patient not taking: Reported on 01/23/2018 07/01/12   Gertha Calkin, PA-C    Allergies    Patient has no known allergies.  Review of Systems   Review of Systems  All other systems reviewed and are negative.  Physical Exam Updated Vital Signs BP 134/88   Pulse 84   Temp 98.2 F (36.8 C)   Resp 16   SpO2 92%   Physical Exam Vitals and nursing note reviewed.  Constitutional:      General: He is not in acute distress.  Appearance: He is well-developed.  HENT:     Head: Atraumatic.  Eyes:     Conjunctiva/sclera: Conjunctivae normal.  Neck:     Comments: No JVD Cardiovascular:     Rate and Rhythm: Normal rate and regular rhythm.     Pulses: Normal pulses.     Heart sounds: Normal heart sounds.  Pulmonary:     Effort: Pulmonary effort is normal.     Breath sounds: Normal breath sounds. No wheezing, rhonchi or rales.  Abdominal:     Palpations: Abdomen is soft.  Musculoskeletal:     Cervical back: Neck supple.     Comments: Left lower extremity: No significant edema appreciated on initial exam.  Intact dorsalis pedis pulse, no tenderness to left calf and negative Homans' sign.  Brisk cap refill throughout all toes, leg compartments soft.  Normal skin color.  No significant changes compared to right lower  extremities visually.  Skin:    Findings: No rash.  Neurological:     Mental Status: He is alert.    ED Results / Procedures / Treatments   Labs (all labs ordered are listed, but only abnormal results are displayed) Labs Reviewed  CBC WITH DIFFERENTIAL/PLATELET - Abnormal; Notable for the following components:      Result Value   Hemoglobin 18.2 (*)    HCT 53.3 (*)    All other components within normal limits  BASIC METABOLIC PANEL - Abnormal; Notable for the following components:   Glucose, Bld 103 (*)    Calcium 8.5 (*)    All other components within normal limits    EKG None  Radiology VAS [place] LOWER EXTREMITY VENOUS (DVT) (ONLY [facility] & [facility])  Result Date: 03/21/2021  Lower Venous DVT Study Patient Name:  Sergio Herman  Date of Exam:   03/21/2021 Medical Rec #: [id number]     Accession #:    [id number] Date of Birth: Aug 06, 1970     Patient Gender: M Patient Age:   48 years Exam Location:  [facility] Procedure:      VAS [place] LOWER EXTREMITY VENOUS (DVT) Referring Phys: BRITNI HENDERLY --------------------------------------------------------------------------------  Indications: Pain.  Comparison Study: No prior study on file Performing Technologist: Darlin Coco RDMS, RVT  Examination Guidelines: A complete evaluation includes B-mode imaging, spectral Doppler, color Doppler, and power Doppler as needed of all accessible portions of each vessel. Bilateral testing is considered an integral part of a complete examination. Limited examinations for reoccurring indications may be performed as noted. The reflux portion of the exam is performed with the patient in reverse Trendelenburg.  +---------+---------------+---------+-----------+----------+--------------+ RIGHT    CompressibilityPhasicitySpontaneityPropertiesThrombus Aging +---------+---------------+---------+-----------+----------+--------------+ CFV      Full           Yes      Yes                                  +---------+---------------+---------+-----------+----------+--------------+ SFJ      Full                                                        +---------+---------------+---------+-----------+----------+--------------+ FV Prox  Full                                                        +---------+---------------+---------+-----------+----------+--------------+  FV Mid   Full                                                        +---------+---------------+---------+-----------+----------+--------------+ FV DistalFull                                                        +---------+---------------+---------+-----------+----------+--------------+ PFV      Full                                                        +---------+---------------+---------+-----------+----------+--------------+ POP      Full           Yes      Yes                                 +---------+---------------+---------+-----------+----------+--------------+ PTV      Full                                                        +---------+---------------+---------+-----------+----------+--------------+ PERO     Full                                                        +---------+---------------+---------+-----------+----------+--------------+ Gastroc  Full                                                        +---------+---------------+---------+-----------+----------+--------------+   +---------+---------------+---------+-----------+----------+--------------+ LEFT     CompressibilityPhasicitySpontaneityPropertiesThrombus Aging +---------+---------------+---------+-----------+----------+--------------+ CFV      Full                                                        +---------+---------------+---------+-----------+----------+--------------+ SFJ      Full                                                         +---------+---------------+---------+-----------+----------+--------------+ FV Prox  Full                                                        +---------+---------------+---------+-----------+----------+--------------+  FV Mid   Full                                                        +---------+---------------+---------+-----------+----------+--------------+ FV DistalFull                                                        +---------+---------------+---------+-----------+----------+--------------+ PFV      Full                                                        +---------+---------------+---------+-----------+----------+--------------+ POP      Full           Yes      Yes                                 +---------+---------------+---------+-----------+----------+--------------+ PTV      Full                                                        +---------+---------------+---------+-----------+----------+--------------+ PERO     Full                                                        +---------+---------------+---------+-----------+----------+--------------+ Gastroc  Full                                                        +---------+---------------+---------+-----------+----------+--------------+     Summary: BILATERAL: - No evidence of deep vein thrombosis seen in the lower extremities, bilaterally. -No evidence of popliteal cyst, bilaterally.   *See table(s) above for measurements and observations. Electronically signed by Waverly Ferrari MD on 03/21/2021 at 4:25:29 PM.    Final     Procedures Procedures   Medications Ordered in ED Medications - No data to display  ED Course  I have reviewed the triage vital signs and the nursing notes.  Pertinent labs & imaging results that were available during my care of the patient were reviewed by me and considered in my medical decision making (see chart for details).    MDM  Rules/Calculators/A&P                           BP 134/88   Pulse 84   Temp 98.2 F (36.8 C)   Resp 16   SpO2 92%   Final Clinical Impression(s) / ED Diagnoses Final diagnoses:  Leg  edema, left    Rx / DC Orders ED Discharge Orders     None      4:00 PM Patient here with complaints of left lower extremity swelling and voice concern for DVT.  Examination of the left lower extremity without any concerning finding no appreciable edema, no signs of cellulitis.  No recent injury to suggest fracture or dislocation.  Patient is neurovascular intact.  Labs obtained remarkable for elevated hemoglobin of 18.2.  Patient admits that he has had history of elevated hemoglobin in the past  4:33 PM DVT study negative for acute DVT.  At this time, recommend patient to wear compressive hose to help with swelling.  Also to keep leg elevated while at rest.  Encourage patient to follow-up with primary care provider for further evaluation.  Recommend patient to return if he develop worsening symptoms or endorse any significant shortness of breath.   Domenic Moras, PA-C 03/21/21 1634    Valarie Merino, MD 03/21/21 [phone]

## 2021-03-21 NOTE — Discharge Instructions (Signed)
You have been evaluated for your left leg swelling.  Fortunately no evidence of blood clot noted on today's ultrasound.  No evidence of skin infection either.  Please consider wearing compressive socks to help with swelling.  Keep your leg elevated while at rest as it will help with swelling as well.  Follow-up with your doctor for further care.  Return if you have any concern.
